# Patient Record
Sex: Male | Born: 1970 | Race: White | Hispanic: No
Health system: Southern US, Community
[De-identification: ages and names within clinical notes are randomized; demographics above are authoritative.]

---

## 1998-08-29 ENCOUNTER — Emergency Department (HOSPITAL_COMMUNITY): Admission: EM | Admit: 1998-08-29 | Discharge: 1998-08-29 | Payer: Self-pay

## 2005-09-30 ENCOUNTER — Encounter: Admission: RE | Admit: 2005-09-30 | Discharge: 2005-09-30 | Payer: Self-pay | Admitting: Internal Medicine

## 2005-10-16 ENCOUNTER — Encounter: Admission: RE | Admit: 2005-10-16 | Discharge: 2005-10-16 | Payer: Self-pay | Admitting: Internal Medicine

## 2007-08-20 IMAGING — CR DG CHEST 2V
2 series · 2 of 2 positions shown · non-contrast
Comparison: none

HISTORY: Right chest and sternal pain

CHEST 2 VIEWS:
No prior studies for comparison.
Normal heart size, mediastinal contours, and vascularity.
Mild bronchitic changes
No infiltrate, effusion, or pneumothorax.
Bones unremarkable.
Specifically, no right rib or sternal abnormality evident.

[w chest pa]
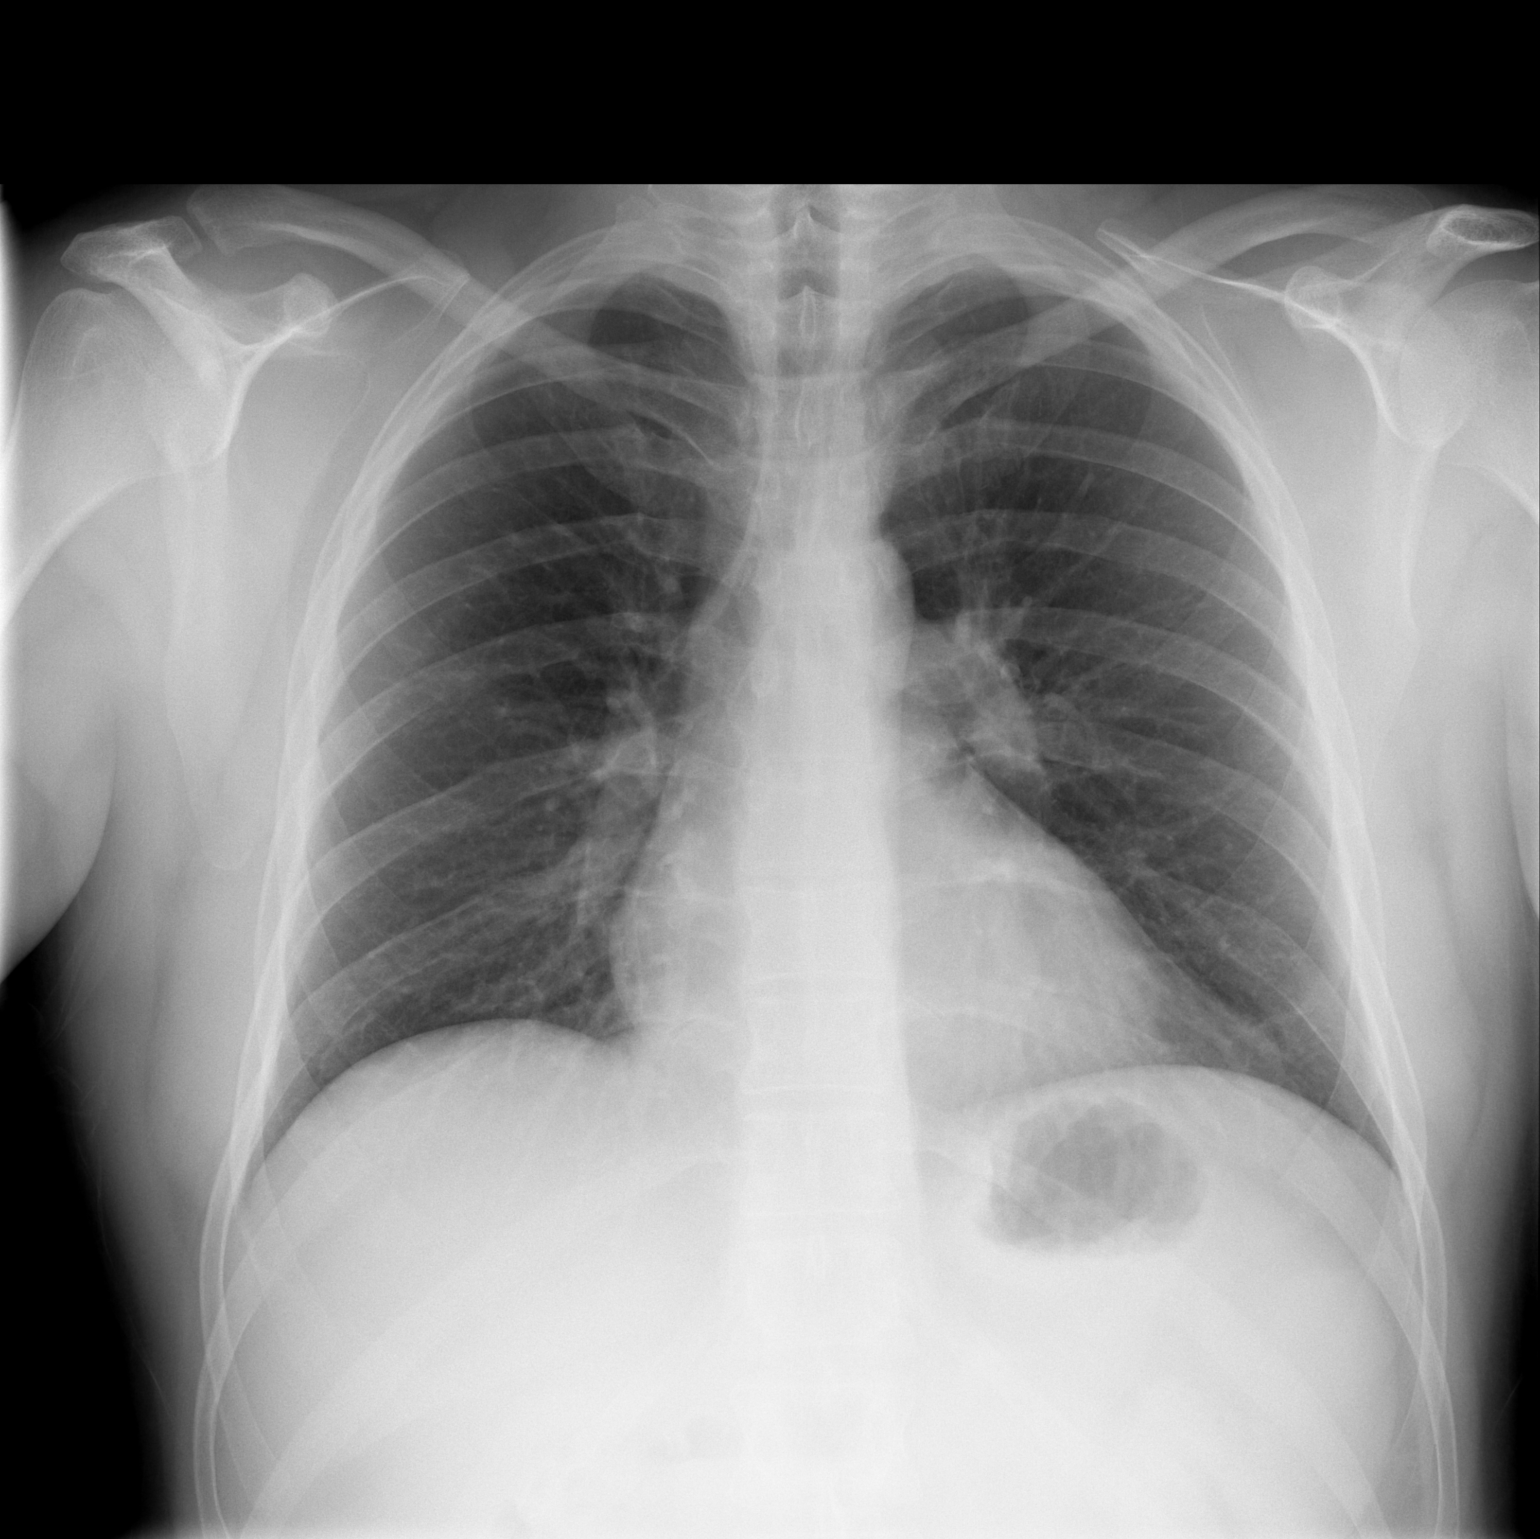

[w chest lat]
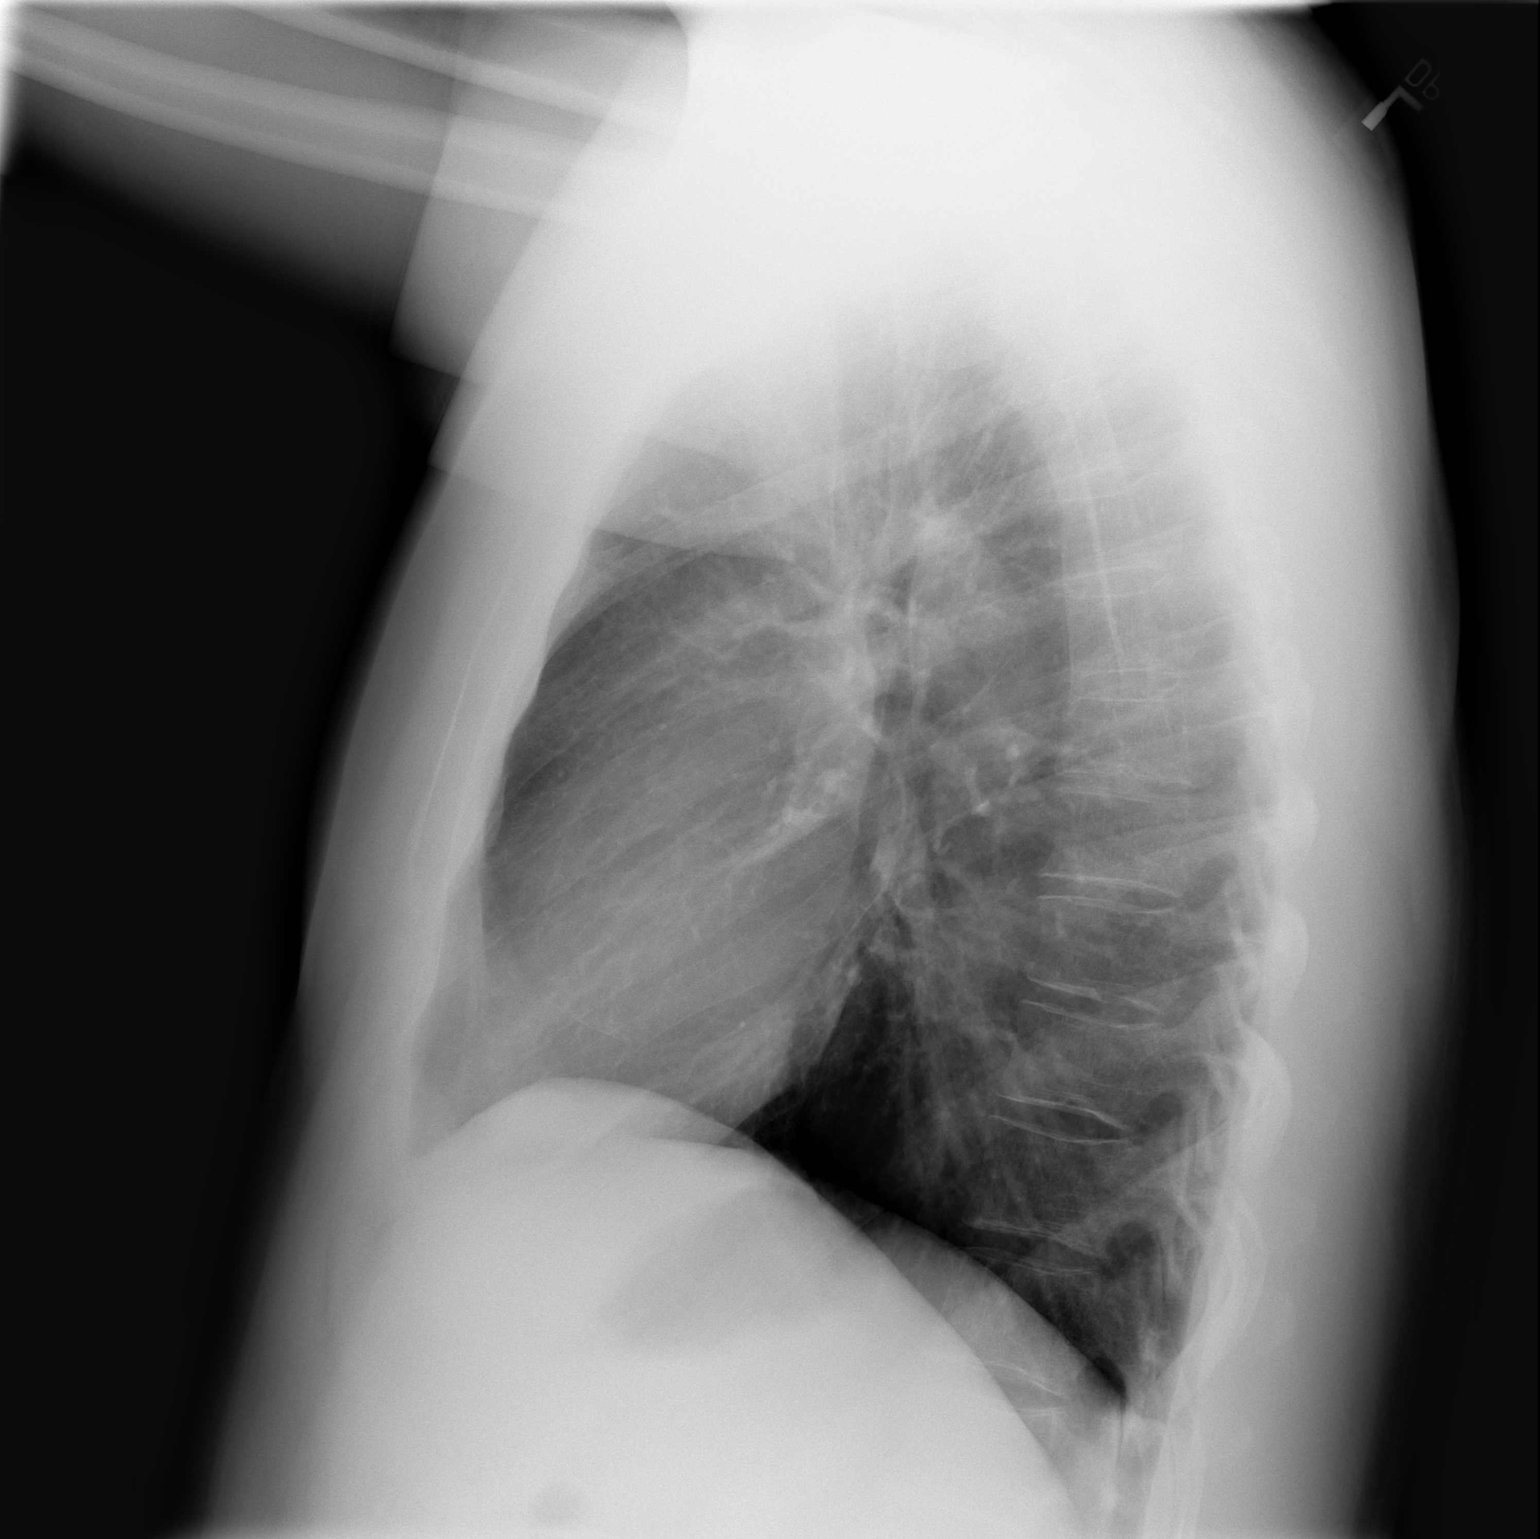

[2 of 2 positions shown; findings below may reference images not displayed]

IMPRESSION: Mild bronchitic changes

## 2010-11-29 ENCOUNTER — Encounter: Payer: Self-pay | Admitting: Internal Medicine

## 2016-08-19 DIAGNOSIS — E784 Other hyperlipidemia: Secondary | ICD-10-CM | POA: Diagnosis not present

## 2016-08-19 DIAGNOSIS — Z0000 Encounter for general adult medical examination without abnormal findings: Secondary | ICD-10-CM | POA: Diagnosis not present

## 2016-08-19 DIAGNOSIS — Z125 Encounter for screening for malignant neoplasm of prostate: Secondary | ICD-10-CM | POA: Diagnosis not present

## 2016-08-25 DIAGNOSIS — Z0000 Encounter for general adult medical examination without abnormal findings: Secondary | ICD-10-CM | POA: Diagnosis not present

## 2016-08-25 DIAGNOSIS — Z125 Encounter for screening for malignant neoplasm of prostate: Secondary | ICD-10-CM | POA: Diagnosis not present

## 2016-08-25 DIAGNOSIS — Z23 Encounter for immunization: Secondary | ICD-10-CM | POA: Diagnosis not present

## 2016-08-25 DIAGNOSIS — Z1389 Encounter for screening for other disorder: Secondary | ICD-10-CM | POA: Diagnosis not present

## 2017-09-01 DIAGNOSIS — Z0000 Encounter for general adult medical examination without abnormal findings: Secondary | ICD-10-CM | POA: Diagnosis not present

## 2017-09-01 DIAGNOSIS — Z125 Encounter for screening for malignant neoplasm of prostate: Secondary | ICD-10-CM | POA: Diagnosis not present

## 2017-09-07 DIAGNOSIS — Z0000 Encounter for general adult medical examination without abnormal findings: Secondary | ICD-10-CM | POA: Diagnosis not present

## 2017-09-07 DIAGNOSIS — E7849 Other hyperlipidemia: Secondary | ICD-10-CM | POA: Diagnosis not present

## 2017-09-07 DIAGNOSIS — Z8719 Personal history of other diseases of the digestive system: Secondary | ICD-10-CM | POA: Diagnosis not present

## 2017-09-07 DIAGNOSIS — Z23 Encounter for immunization: Secondary | ICD-10-CM | POA: Diagnosis not present

## 2017-09-07 DIAGNOSIS — E781 Pure hyperglyceridemia: Secondary | ICD-10-CM | POA: Diagnosis not present

## 2017-09-07 DIAGNOSIS — F17201 Nicotine dependence, unspecified, in remission: Secondary | ICD-10-CM | POA: Diagnosis not present

## 2017-09-07 DIAGNOSIS — Z1389 Encounter for screening for other disorder: Secondary | ICD-10-CM | POA: Diagnosis not present

## 2017-09-14 DIAGNOSIS — Z1212 Encounter for screening for malignant neoplasm of rectum: Secondary | ICD-10-CM | POA: Diagnosis not present

## 2018-09-07 DIAGNOSIS — E7849 Other hyperlipidemia: Secondary | ICD-10-CM | POA: Diagnosis not present

## 2018-09-07 DIAGNOSIS — Z0000 Encounter for general adult medical examination without abnormal findings: Secondary | ICD-10-CM | POA: Diagnosis not present

## 2018-09-07 DIAGNOSIS — Z125 Encounter for screening for malignant neoplasm of prostate: Secondary | ICD-10-CM | POA: Diagnosis not present

## 2018-09-14 DIAGNOSIS — Z23 Encounter for immunization: Secondary | ICD-10-CM | POA: Diagnosis not present

## 2018-09-14 DIAGNOSIS — E7849 Other hyperlipidemia: Secondary | ICD-10-CM | POA: Diagnosis not present

## 2018-09-14 DIAGNOSIS — G5710 Meralgia paresthetica, unspecified lower limb: Secondary | ICD-10-CM | POA: Diagnosis not present

## 2018-09-14 DIAGNOSIS — Z8719 Personal history of other diseases of the digestive system: Secondary | ICD-10-CM | POA: Diagnosis not present

## 2018-09-14 DIAGNOSIS — E781 Pure hyperglyceridemia: Secondary | ICD-10-CM | POA: Diagnosis not present

## 2018-09-14 DIAGNOSIS — Z1389 Encounter for screening for other disorder: Secondary | ICD-10-CM | POA: Diagnosis not present

## 2018-09-14 DIAGNOSIS — Z0000 Encounter for general adult medical examination without abnormal findings: Secondary | ICD-10-CM | POA: Diagnosis not present

## 2018-09-23 DIAGNOSIS — Z1212 Encounter for screening for malignant neoplasm of rectum: Secondary | ICD-10-CM | POA: Diagnosis not present

## 2019-09-18 DIAGNOSIS — Z23 Encounter for immunization: Secondary | ICD-10-CM | POA: Diagnosis not present

## 2019-09-18 DIAGNOSIS — Z0000 Encounter for general adult medical examination without abnormal findings: Secondary | ICD-10-CM | POA: Diagnosis not present

## 2019-09-18 DIAGNOSIS — Z125 Encounter for screening for malignant neoplasm of prostate: Secondary | ICD-10-CM | POA: Diagnosis not present

## 2019-09-18 DIAGNOSIS — E7849 Other hyperlipidemia: Secondary | ICD-10-CM | POA: Diagnosis not present

## 2019-09-25 DIAGNOSIS — Z0000 Encounter for general adult medical examination without abnormal findings: Secondary | ICD-10-CM | POA: Diagnosis not present

## 2019-09-25 DIAGNOSIS — Z1331 Encounter for screening for depression: Secondary | ICD-10-CM | POA: Diagnosis not present

## 2020-09-26 DIAGNOSIS — Z0000 Encounter for general adult medical examination without abnormal findings: Secondary | ICD-10-CM | POA: Diagnosis not present

## 2020-09-26 DIAGNOSIS — E785 Hyperlipidemia, unspecified: Secondary | ICD-10-CM | POA: Diagnosis not present

## 2020-09-26 DIAGNOSIS — Z125 Encounter for screening for malignant neoplasm of prostate: Secondary | ICD-10-CM | POA: Diagnosis not present

## 2020-09-30 DIAGNOSIS — Z23 Encounter for immunization: Secondary | ICD-10-CM | POA: Diagnosis not present

## 2020-09-30 DIAGNOSIS — Z0000 Encounter for general adult medical examination without abnormal findings: Secondary | ICD-10-CM | POA: Diagnosis not present

## 2020-09-30 DIAGNOSIS — Z1331 Encounter for screening for depression: Secondary | ICD-10-CM | POA: Diagnosis not present

## 2020-09-30 DIAGNOSIS — Z1339 Encounter for screening examination for other mental health and behavioral disorders: Secondary | ICD-10-CM | POA: Diagnosis not present

## 2020-09-30 DIAGNOSIS — E785 Hyperlipidemia, unspecified: Secondary | ICD-10-CM | POA: Diagnosis not present

## 2020-09-30 DIAGNOSIS — R82998 Other abnormal findings in urine: Secondary | ICD-10-CM | POA: Diagnosis not present

## 2020-10-24 DIAGNOSIS — Z1212 Encounter for screening for malignant neoplasm of rectum: Secondary | ICD-10-CM | POA: Diagnosis not present

## 2020-11-29 DIAGNOSIS — B029 Zoster without complications: Secondary | ICD-10-CM | POA: Diagnosis not present

## 2021-10-29 DIAGNOSIS — E781 Pure hyperglyceridemia: Secondary | ICD-10-CM | POA: Diagnosis not present

## 2021-10-29 DIAGNOSIS — Z125 Encounter for screening for malignant neoplasm of prostate: Secondary | ICD-10-CM | POA: Diagnosis not present

## 2021-10-29 DIAGNOSIS — E785 Hyperlipidemia, unspecified: Secondary | ICD-10-CM | POA: Diagnosis not present

## 2021-11-05 DIAGNOSIS — Z23 Encounter for immunization: Secondary | ICD-10-CM | POA: Diagnosis not present

## 2021-11-05 DIAGNOSIS — Z1331 Encounter for screening for depression: Secondary | ICD-10-CM | POA: Diagnosis not present

## 2021-11-05 DIAGNOSIS — Z0000 Encounter for general adult medical examination without abnormal findings: Secondary | ICD-10-CM | POA: Diagnosis not present

## 2021-11-05 DIAGNOSIS — Z1339 Encounter for screening examination for other mental health and behavioral disorders: Secondary | ICD-10-CM | POA: Diagnosis not present

## 2021-11-05 DIAGNOSIS — E785 Hyperlipidemia, unspecified: Secondary | ICD-10-CM | POA: Diagnosis not present

## 2021-11-05 DIAGNOSIS — G5710 Meralgia paresthetica, unspecified lower limb: Secondary | ICD-10-CM | POA: Diagnosis not present

## 2022-11-05 DIAGNOSIS — Z125 Encounter for screening for malignant neoplasm of prostate: Secondary | ICD-10-CM | POA: Diagnosis not present

## 2022-11-05 DIAGNOSIS — E785 Hyperlipidemia, unspecified: Secondary | ICD-10-CM | POA: Diagnosis not present

## 2022-11-11 ENCOUNTER — Other Ambulatory Visit: Payer: Self-pay | Admitting: Internal Medicine

## 2022-11-11 DIAGNOSIS — R82998 Other abnormal findings in urine: Secondary | ICD-10-CM | POA: Diagnosis not present

## 2022-11-11 DIAGNOSIS — Z1331 Encounter for screening for depression: Secondary | ICD-10-CM | POA: Diagnosis not present

## 2022-11-11 DIAGNOSIS — E785 Hyperlipidemia, unspecified: Secondary | ICD-10-CM

## 2022-11-11 DIAGNOSIS — Z0000 Encounter for general adult medical examination without abnormal findings: Secondary | ICD-10-CM | POA: Diagnosis not present

## 2022-11-11 DIAGNOSIS — Z1339 Encounter for screening examination for other mental health and behavioral disorders: Secondary | ICD-10-CM | POA: Diagnosis not present

## 2022-11-11 DIAGNOSIS — Z23 Encounter for immunization: Secondary | ICD-10-CM | POA: Diagnosis not present

## 2022-12-08 DIAGNOSIS — Z1211 Encounter for screening for malignant neoplasm of colon: Secondary | ICD-10-CM | POA: Diagnosis not present

## 2023-01-14 ENCOUNTER — Ambulatory Visit
Admission: RE | Admit: 2023-01-14 | Discharge: 2023-01-14 | Disposition: A | Discharge status: Home / Self Care | Payer: No Typology Code available for payment source | Source: Ambulatory Visit | Attending: Internal Medicine | Admitting: Internal Medicine

## 2023-01-14 DIAGNOSIS — E785 Hyperlipidemia, unspecified: Secondary | ICD-10-CM

## 2023-11-24 DIAGNOSIS — E785 Hyperlipidemia, unspecified: Secondary | ICD-10-CM | POA: Diagnosis not present

## 2023-12-01 DIAGNOSIS — Z23 Encounter for immunization: Secondary | ICD-10-CM | POA: Diagnosis not present

## 2023-12-01 DIAGNOSIS — R82998 Other abnormal findings in urine: Secondary | ICD-10-CM | POA: Diagnosis not present

## 2023-12-01 DIAGNOSIS — Z1339 Encounter for screening examination for other mental health and behavioral disorders: Secondary | ICD-10-CM | POA: Diagnosis not present

## 2023-12-01 DIAGNOSIS — Z1331 Encounter for screening for depression: Secondary | ICD-10-CM | POA: Diagnosis not present

## 2023-12-01 DIAGNOSIS — Z0000 Encounter for general adult medical examination without abnormal findings: Secondary | ICD-10-CM | POA: Diagnosis not present

## 2023-12-01 DIAGNOSIS — E785 Hyperlipidemia, unspecified: Secondary | ICD-10-CM | POA: Diagnosis not present
